# Patient Record
Sex: Female | Born: 1945 | Race: White | Hispanic: No | Smoking: Former smoker
Health system: Southern US, Community
[De-identification: ages and names within clinical notes are randomized; demographics above are authoritative.]

## PROBLEM LIST (undated history)

## (undated) DIAGNOSIS — M1990 Unspecified osteoarthritis, unspecified site: Secondary | ICD-10-CM

## (undated) DIAGNOSIS — I10 Essential (primary) hypertension: Secondary | ICD-10-CM

## (undated) DIAGNOSIS — F419 Anxiety disorder, unspecified: Secondary | ICD-10-CM

## (undated) HISTORY — PX: ABDOMINAL HYSTERECTOMY: SHX81

---

## 2020-04-28 ENCOUNTER — Other Ambulatory Visit: Payer: Self-pay

## 2020-04-28 DIAGNOSIS — Z20822 Contact with and (suspected) exposure to covid-19: Secondary | ICD-10-CM

## 2020-04-30 LAB — NOVEL CORONAVIRUS, NAA: SARS-CoV-2, NAA: NOT DETECTED

## 2020-04-30 LAB — SPECIMEN STATUS REPORT

## 2020-04-30 LAB — SARS-COV-2, NAA 2 DAY TAT

## 2020-06-13 ENCOUNTER — Other Ambulatory Visit (HOSPITAL_COMMUNITY): Payer: Self-pay | Admitting: Family Medicine

## 2020-06-13 DIAGNOSIS — Z780 Asymptomatic menopausal state: Secondary | ICD-10-CM

## 2020-06-18 ENCOUNTER — Emergency Department (HOSPITAL_COMMUNITY): Payer: Medicare Other

## 2020-06-18 ENCOUNTER — Encounter (HOSPITAL_COMMUNITY): Payer: Self-pay | Admitting: Emergency Medicine

## 2020-06-18 ENCOUNTER — Emergency Department (HOSPITAL_COMMUNITY)
Admission: EM | Admit: 2020-06-18 | Discharge: 2020-06-18 | Disposition: A | Discharge status: Home / Self Care | Payer: Medicare Other | Attending: Emergency Medicine | Admitting: Emergency Medicine

## 2020-06-18 ENCOUNTER — Other Ambulatory Visit: Payer: Self-pay

## 2020-06-18 DIAGNOSIS — Z7982 Long term (current) use of aspirin: Secondary | ICD-10-CM | POA: Diagnosis not present

## 2020-06-18 DIAGNOSIS — R1013 Epigastric pain: Secondary | ICD-10-CM | POA: Diagnosis not present

## 2020-06-18 DIAGNOSIS — Z87891 Personal history of nicotine dependence: Secondary | ICD-10-CM | POA: Insufficient documentation

## 2020-06-18 DIAGNOSIS — R110 Nausea: Secondary | ICD-10-CM | POA: Diagnosis not present

## 2020-06-18 DIAGNOSIS — I10 Essential (primary) hypertension: Secondary | ICD-10-CM | POA: Diagnosis not present

## 2020-06-18 HISTORY — DX: Unspecified osteoarthritis, unspecified site: M19.90

## 2020-06-18 HISTORY — DX: Essential (primary) hypertension: I10

## 2020-06-18 HISTORY — DX: Anxiety disorder, unspecified: F41.9

## 2020-06-18 LAB — COMPREHENSIVE METABOLIC PANEL
ALT: 19 U/L (ref 0–44)
AST: 24 U/L (ref 15–41)
Albumin: 4 g/dL (ref 3.5–5.0)
Alkaline Phosphatase: 53 U/L (ref 38–126)
Anion gap: 10 (ref 5–15)
BUN: 10 mg/dL (ref 8–23)
CO2: 26 mmol/L (ref 22–32)
Calcium: 9.4 mg/dL (ref 8.9–10.3)
Chloride: 105 mmol/L (ref 98–111)
Creatinine, Ser: 0.47 mg/dL (ref 0.44–1.00)
GFR, Estimated: >60 mL/min (ref >60)
Glucose, Bld: 95 mg/dL (ref 70–99)
Potassium: 4.1 mmol/L (ref 3.5–5.1)
Sodium: 141 mmol/L (ref 135–145)
Total Bilirubin: 0.6 mg/dL (ref 0.3–1.2)
Total Protein: 6.9 g/dL (ref 6.5–8.1)

## 2020-06-18 LAB — CBC
HCT: 39.7 % (ref 36.0–46.0)
Hemoglobin: 12.9 g/dL (ref 12.0–15.0)
MCH: 31.5 pg (ref 26.0–34.0)
MCHC: 32.5 g/dL (ref 30.0–36.0)
MCV: 97.1 fL (ref 80.0–100.0)
Platelets: 248 10*3/uL (ref 150–400)
RBC: 4.09 MIL/uL (ref 3.87–5.11)
RDW: 12.4 % (ref 11.5–15.5)
WBC: 3.8 10*3/uL — ABNORMAL LOW (ref 4.0–10.5)
nRBC: 0 % (ref 0.0–0.2)

## 2020-06-18 LAB — TROPONIN I (HIGH SENSITIVITY): Troponin I (High Sensitivity): 2 ng/L (ref <18)

## 2020-06-18 LAB — LIPASE, BLOOD: Lipase: 31 U/L (ref 11–51)

## 2020-06-18 IMAGING — DX DG CHEST 1V PORT
1 series · 1 of 1 positions shown · non-contrast
Comparison: None.

CLINICAL DATA: Epigastric pain.

EXAM:
PORTABLE CHEST 1 VIEW

[chest ap]
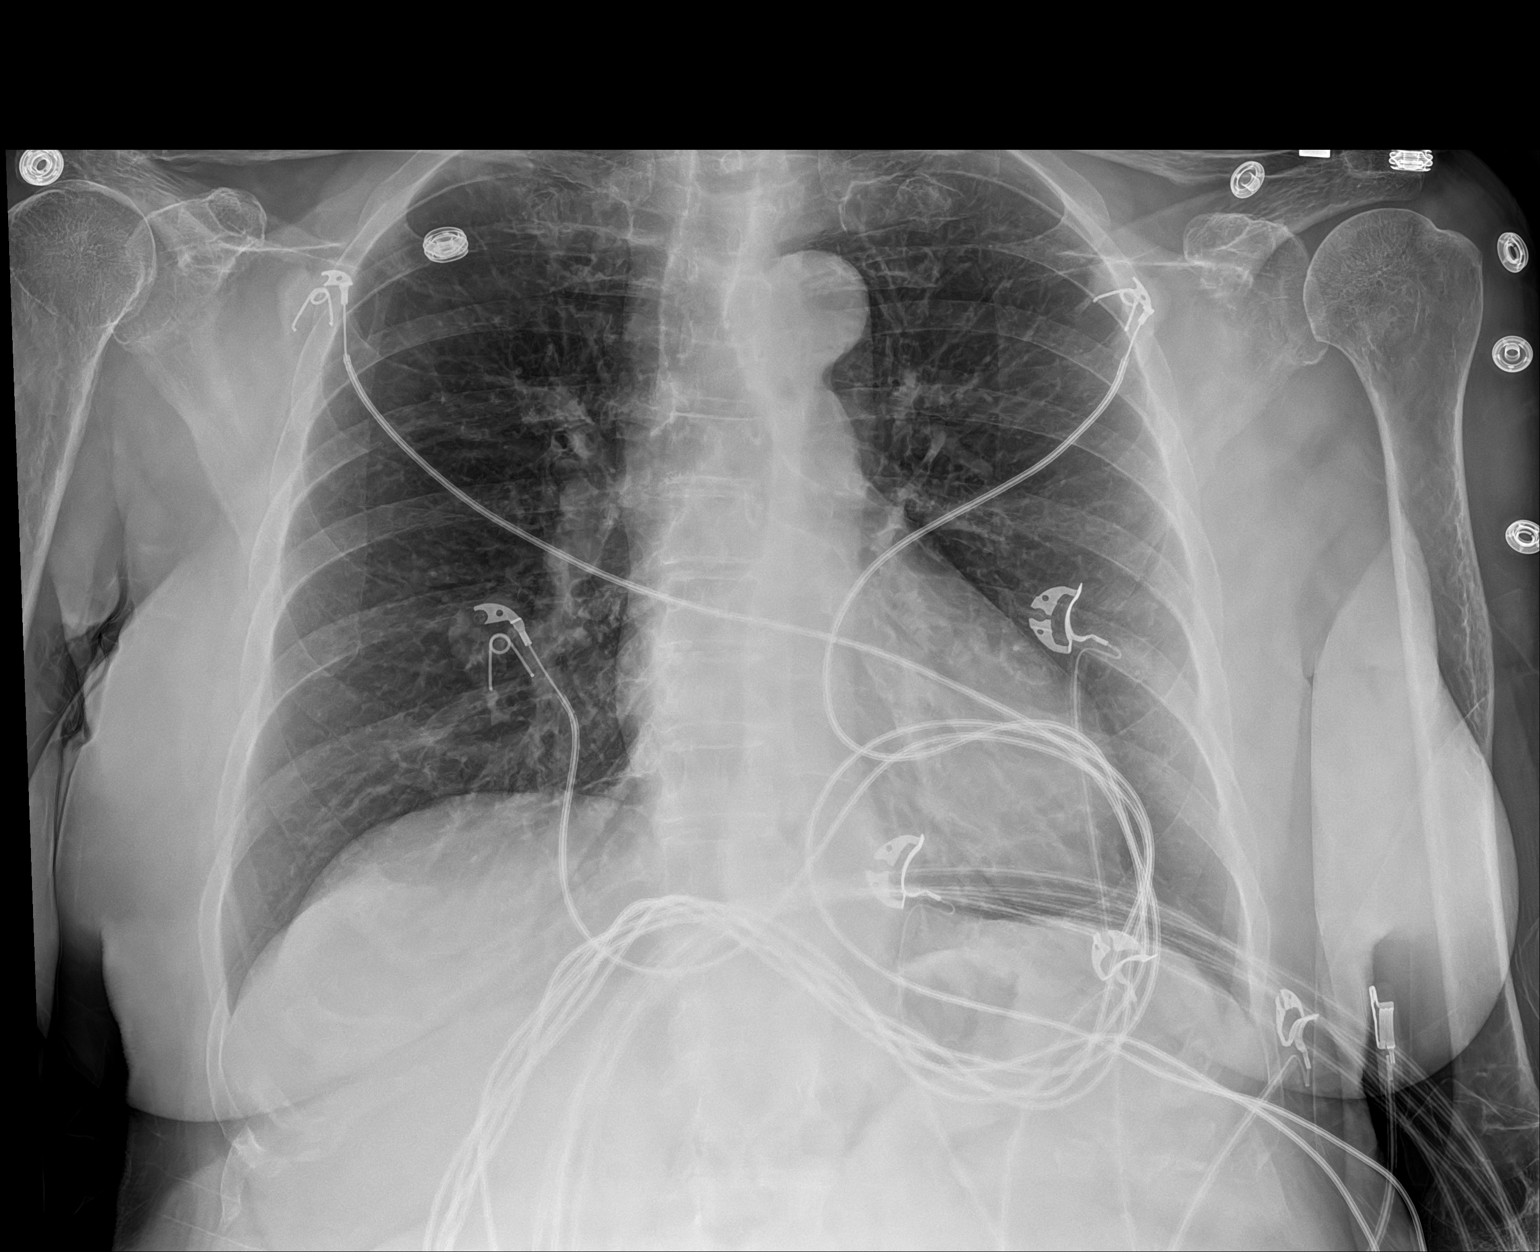

[1 of 1 positions shown; findings below may reference images not displayed]

FINDINGS: The heart size and mediastinal contours are within normal limits.
Both lungs are clear. The visualized skeletal structures are
unremarkable.
IMPRESSION: No active disease.

## 2020-06-18 NOTE — ED Triage Notes (Signed)
Patient states that she woke last night with neck pain that radiates to chest, upper gastric area. 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 States that she had nausea last night.

## 2020-06-18 NOTE — Discharge Instructions (Addendum)
Your testing here today appears normal which includes a normal heart tracing, heart enzymes, liver function tests, and electrolytes.  Your white blood cell count was slightly decreased and will need to follow-up as an outpatient but is unlikely to be contributing to the issues today. Please return the emergency department if you have return of pain or worsening symptoms.  Otherwise, please follow-up with Dr. Kaleen Odea this week.

## 2020-06-18 NOTE — ED Provider Notes (Signed)
[facility] EMERGENCY DEPARTMENT Provider Note   CSN: [id number] Arrival date & time: 06/18/20  1036     History Chief Complaint  Patient presents with  . Chest Pain    Felicia Boyer is a 75 y.o. female.  HPI   75 year old female history of anxiety presents today complaining of epigastric pain that woke her up from her sleep last night about 10:00.  It was sharp and stabbing.  She had some associated nausea.  It occurred while she was laying in bed and worsened with sitting up in bed.  She states that it resolved after about 10 minutes.  She has not had any recurrence.  She has not had any nausea, vomiting, diarrhea, cough, dyspnea, or chills.  Nursing notes no pain to left neck.  She points to the epigastrium to me and states that it went straight through to her back.  She has not had any similar episodes in the past.  It was severe but has resolved completely.  She has been eating and drinking without difficulty.  She denies any history of abdominal aortic aneurysm, coronary artery disease, pulmonary issues, spontaneous pneumothorax, gallbladder or pancreatic issues.  Past Medical History:  Diagnosis Date  . Anxiety   . Arthritis   . Hypertension     There are no problems to display for this patient.   Past Surgical History:  Procedure Laterality Date  . ABDOMINAL HYSTERECTOMY       OB History   No obstetric history on file.     History reviewed. No pertinent family history.  Social History   Tobacco Use  . Smoking status: Former Smoker    Types: Cigarettes    Quit date: 06/19/1995    Years since quitting: 25.0    Home Medications Prior to Admission medications   Medication Sig Start Date End Date Taking? Authorizing Provider  ALPRAZolam Prudy Feeler) 0.5 MG tablet Take 0.5 mg by mouth 4 (four) times daily as needed for anxiety. 06/13/20  Yes [provider]  aspirin EC 81 MG tablet Take 81 mg by mouth daily. Swallow whole.   Yes [provider]   AZO-CRANBERRY PO Take 1 capsule by mouth daily.   Yes [provider]  Bioflavonoid Products (ESTER-C) 500-550 MG TABS Take 1 tablet by mouth daily.   Yes [provider]  glucosamine-chondroitin 500-400 MG tablet Take 1 tablet by mouth 3 (three) times daily.   Yes [provider]  Omega-3 Fatty Acids (FISH OIL) 1000 MG CAPS Take 1 capsule by mouth daily.   Yes [provider]  omeprazole (PRILOSEC) 20 MG capsule Take 1 capsule by mouth daily. 06/13/20  Yes [provider]    Allergies    Sulfa antibiotics  Review of Systems   Review of Systems  All other systems reviewed and are negative.   Physical Exam Updated Vital Signs BP (!) 133/96 (BP Location: Left Arm)   Pulse 78   Temp 98.1 F (36.7 C) (Oral)   Resp 13   Ht 1.6 m (5\' 3" )   Wt 58.7 kg   SpO2 96%   BMI 22.94 kg/m   Physical Exam Vitals and nursing note reviewed.  Constitutional:      General: She is not in acute distress.    Appearance: She is well-developed. She is not ill-appearing.  HENT:     Head: Normocephalic.  Eyes:     Pupils: Pupils are equal, round, and reactive to light.  Cardiovascular:  Rate and Rhythm: Normal rate and regular rhythm.     Pulses:          Carotid pulses are 2+ on the right side and 2+ on the left side.      Radial pulses are 1+ on the right side and 1+ on the left side.       Dorsalis pedis pulses are 1+ on the right side and 1+ on the left side.     Heart sounds: Normal heart sounds. Heart sounds not distant.   No systolic murmur is present.     Comments: Femoral pulses equal bilaterally Pulmonary:     Effort: Pulmonary effort is normal. No tachypnea.     Breath sounds: Normal breath sounds.  Chest:     Chest wall: No mass.  Abdominal:     General: Bowel sounds are normal. There is no abdominal bruit.     Palpations: Abdomen is soft. There is no hepatomegaly or mass.  Musculoskeletal:        General: Normal range of motion.      Cervical back: Normal range of motion and neck supple.  Skin:    General: Skin is warm.     Capillary Refill: Capillary refill takes less than 2 seconds.  Neurological:     General: No focal deficit present.     Mental Status: She is alert.     ED Results / Procedures / Treatments   Labs (all labs ordered are listed, but only abnormal results are displayed) Labs Reviewed - No data to display  EKG EKG Interpretation  Date/Time:  Sunday June 18 2020 10:50:55 EST Ventricular Rate:  80 PR Interval:    QRS Duration: 90 QT Interval:  382 QTC Calculation: 441 R Axis:   37 Text Interpretation: Sinus rhythm Borderline low voltage, extremity leads Confirmed by Margarita Grizzle [phone]) on 06/18/2020 10:53:35 AM   Radiology DG Chest Port 1 View  Result Date: 06/18/2020 CLINICAL DATA:  Epigastric pain. EXAM: PORTABLE CHEST 1 VIEW COMPARISON:  None. FINDINGS: The heart size and mediastinal contours are within normal limits. Both lungs are clear. The visualized skeletal structures are unremarkable. IMPRESSION: No active disease. Electronically Signed   By: Gerome Sam III M.D   On: 06/18/2020 11:45    Procedures Procedures   Medications Ordered in ED Medications - No data to display  ED Course  I have reviewed the triage vital signs and the nursing notes.  Pertinent labs & imaging results that were available during my care of the patient were reviewed by me and considered in my medical decision making (see chart for details).    MDM Rules/Calculators/A&P                         Differential diagnosis includes acute coronary syndrome, acute intrathoracic or intra-abdominal abnormality including gallbladder, pancreas, gastritis, bowel obstruction, aneurysm/dissection.  Acute coronary syndrome appears unlikely given one isolated episode of pain with normal EKG and normal troponin. Aneurysm or dissection also appear unlikely given the pain with isolated has not recurred.  Her exam  is normal with no palpatory mass in her upper abdomen and no tenderness Additionally acute cholecystitis, pancreatitis and gastritis appear less likely given normal labs, normal exam, and normal imaging of chest with no evidence of free air or acute abnormality in the upper abdomen 75 yo female with epigastric pain that appears to be musculoskeletal by history and exam.  Pain lasted for 10 minutes last night.  Here abdomen is soft and nontender.  She was initially mildly hypertensive but this is resolved.  Chest x-Perris Tripathi shows no active disease.  EKG without evidence of acute ischemia.  1 of trauma troponin only was obtained as pain occurred greater than 1212 hrs. ago.  She is pain-free here with normal labs. Patient appears stable for discharge.  She is advised regarding return precautions and need for follow-up and voices understanding. Final Clinical Impression(s) / ED Diagnoses Final diagnoses:  Epigastric pain    Rx / DC Orders ED Discharge Orders    None       Margarita Grizzle, MD 06/18/20 1230

## 2020-06-20 ENCOUNTER — Ambulatory Visit (HOSPITAL_COMMUNITY)
Admission: RE | Admit: 2020-06-20 | Discharge: 2020-06-20 | Disposition: A | Discharge status: Home / Self Care | Payer: Medicare Other | Source: Ambulatory Visit | Attending: Family Medicine | Admitting: Family Medicine

## 2020-06-20 DIAGNOSIS — Z780 Asymptomatic menopausal state: Secondary | ICD-10-CM | POA: Insufficient documentation
